# Patient Record
Sex: Male | Born: 2000 | Race: White | Hispanic: No | Marital: Single | Smoking: Never smoker
Health system: Southern US, Community
[De-identification: ages and names within clinical notes are randomized; demographics above are authoritative.]

## PROBLEM LIST (undated history)

## (undated) DIAGNOSIS — R7303 Prediabetes: Secondary | ICD-10-CM

## (undated) HISTORY — DX: Prediabetes: R73.03

---
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
## 2021-08-27 ENCOUNTER — Ambulatory Visit: Payer: BC Managed Care – PPO

## 2023-08-05 DIAGNOSIS — Z0289 Encounter for other administrative examinations: Secondary | ICD-10-CM

## 2023-08-09 ENCOUNTER — Ambulatory Visit (INDEPENDENT_AMBULATORY_CARE_PROVIDER_SITE_OTHER): Payer: BC Managed Care – PPO | Admitting: Bariatrics

## 2023-08-09 ENCOUNTER — Encounter: Payer: Self-pay | Admitting: Bariatrics

## 2023-08-09 VITALS — BP 146/77 | HR 83 | Temp 97.8°F | Ht 68.0 in | Wt 369.0 lb

## 2023-08-09 DIAGNOSIS — E65 Localized adiposity: Secondary | ICD-10-CM

## 2023-08-09 DIAGNOSIS — R030 Elevated blood-pressure reading, without diagnosis of hypertension: Secondary | ICD-10-CM | POA: Diagnosis not present

## 2023-08-09 DIAGNOSIS — Z6841 Body Mass Index (BMI) 40.0 and over, adult: Secondary | ICD-10-CM

## 2023-08-09 NOTE — Progress Notes (Signed)
Office: [phone]  /  Fax: [phone]   Initial Visit  Nicholas Nixon was seen in clinic today to evaluate for obesity. He is interested in losing weight to improve overall health and reduce the risk of weight related complications. He presents today to review program treatment options, initial physical assessment, and evaluation.     He was referred by: Friend or Family  When asked what else they would like to accomplish? He states: Adopt healthier eating patterns and Improve quality of life  When asked how has your weight affected you? He states: Problems with eating patterns (boredom)   Some associated conditions: Prediabetes Took Metformin   Contributing factors: Family history and Stress  Weight promoting medications identified: None  Current nutrition plan: Portion control / smart choices and Other: Optivia,   Current level of physical activity: Walking and NEAT  Current or previous pharmacotherapy: None  Response to medication: Never tried medications   Past medical history includes:  History reviewed. No pertinent past medical history.   Objective:   BP (!) 146/77   Pulse 83   Temp 97.8 F (36.6 C)   Ht 5\' 8"  (1.727 m)   Wt 269 lb (122 kg)   SpO2 98%   BMI 40.90 kg/m  He was weighed on the bioimpedance scale: Body mass index is 40.9 kg/m.  Peak Weight:269 , Body Fat%:45.2%, Visceral Fat Rating:31, Weight trend over the last 12 months: Increasing  General:  Alert, oriented and cooperative. Patient is in no acute distress.  Respiratory: Normal respiratory effort, no problems with respiration noted  Extremities: Normal range of motion.    Mental Status: Normal mood and affect. Normal behavior. Normal judgment and thought content.   DIAGNOSTIC DATA REVIEWED:  BMET No results found for: "NA", "K", "CL", "CO2", "GLUCOSE", "BUN", "CREATININE", "CALCIUM", "GFRNONAA", "GFRAA" No results found for: "HGBA1C" No results found for: "INSULIN" CBC No  results found for: "WBC", "RBC", "HGB", "HCT", "PLT", "MCV", "MCH", "MCHC", "RDW" Iron/TIBC/Ferritin/ %Sat No results found for: "IRON", "TIBC", "FERRITIN", "IRONPCTSAT" Lipid Panel  No results found for: "CHOL", "TRIG", "HDL", "CHOLHDL", "VLDL", "LDLCALC", "LDLDIRECT" Hepatic Function Panel  No results found for: "PROT", "ALBUMIN", "AST", "ALT", "ALKPHOS", "BILITOT", "BILIDIR", "IBILI" No results found for: "TSH"   Assessment and Plan:   Elevated blood pressure without diagnosis of hypertension Blood pressure is noted to be borderline elevated today, but normal in the past. Allante denies chest pains or SOB. BP Readings from Last 3 Encounters:  08/09/23 (!) 146/77    Plan: Continue to monitor.  If blood pressure continues to be elevated at future office visits, will consider starting antihypertensive medication. Patient will recheck BP at home in the AM 3 times a week and will bring in readings.  No added salt.    Visceral Obesity.   He has a visceral fat rating of 31 per the bio-impedence scale.   Plan: The goal is a visceral fat rating of 13 or below.  Will work on the plan and increase exercise/begin exercise.  Information sheet on " Tips to lose belly fat ". Aware that belly fat may be equate to visceral fat, but many of the same tips can help both subcutaneous and visceral fat.  Information sheet on " Healthy and Unhealthy fats.  Will minimize all carbohydrates ( sweets and starches ).     Morbid Obesity: Current BMI 40.90    Obesity Treatment / Action Plan:  Patient will work on garnering support from family and friends to begin weight  loss journey. Will work on eliminating or reducing the presence of highly palatable, calorie dense foods in the home. Will complete provided nutritional and psychosocial assessment questionnaire before the next appointment. Will be scheduled for indirect calorimetry to determine resting energy expenditure in a fasting state.  This will  allow [place] to create a reduced calorie, high-protein meal plan to promote loss of fat mass while preserving muscle mass. Counseled on the health benefits of losing 5%-15% of total body weight. Was counseled on nutritional approaches to weight loss and benefits of reducing processed foods and consuming plant-based foods and high quality protein as part of nutritional weight management. Was counseled on pharmacotherapy and role as an adjunct in weight management.   Obesity Education Performed Today:  He was weighed on the bioimpedance scale and results were discussed and documented in the synopsis.  We discussed obesity as a disease and the importance of a more detailed evaluation of all the factors contributing to the disease.  We discussed the importance of long term lifestyle changes which include nutrition, exercise and behavioral modifications as well as the importance of customizing this to his specific health and social needs.  We discussed the benefits of reaching a healthier weight to alleviate the symptoms of existing conditions and reduce the risks of the biomechanical, metabolic and psychological effects of obesity.  Discussed New Patient/Late Arrival, and Cancellation Policies. Patient voiced understanding and allowed to ask questions.   PAYTON PRINSEN appears to be in the action stage of change and states they are ready to start intensive lifestyle modifications and behavioral modifications.  30 minutes was spent today on this visit including the above counseling, pre-visit chart review, and post-visit documentation.  Reviewed by clinician on day of visit: allergies, medications, problem list, medical history, surgical history, family history, social history, and previous encounter notes.    Gavrielle Streck A. Lorretta HarpO.

## 2023-08-18 ENCOUNTER — Encounter: Payer: Self-pay | Admitting: Bariatrics

## 2023-08-18 ENCOUNTER — Ambulatory Visit: Payer: BC Managed Care – PPO | Admitting: Bariatrics

## 2023-08-18 VITALS — BP 112/69 | HR 71 | Temp 97.8°F | Ht 68.0 in | Wt 365.0 lb

## 2023-08-18 DIAGNOSIS — Z6841 Body Mass Index (BMI) 40.0 and over, adult: Secondary | ICD-10-CM | POA: Diagnosis not present

## 2023-08-18 DIAGNOSIS — R5383 Other fatigue: Secondary | ICD-10-CM | POA: Diagnosis not present

## 2023-08-18 DIAGNOSIS — R0602 Shortness of breath: Secondary | ICD-10-CM | POA: Diagnosis not present

## 2023-08-18 DIAGNOSIS — Z0000 Encounter for general adult medical examination without abnormal findings: Secondary | ICD-10-CM

## 2023-08-18 DIAGNOSIS — E559 Vitamin D deficiency, unspecified: Secondary | ICD-10-CM | POA: Diagnosis not present

## 2023-08-18 DIAGNOSIS — Z1331 Encounter for screening for depression: Secondary | ICD-10-CM

## 2023-08-18 DIAGNOSIS — Z833 Family history of diabetes mellitus: Secondary | ICD-10-CM | POA: Diagnosis not present

## 2023-08-18 NOTE — Progress Notes (Signed)
At a Glance:  Vitals Temp: 97.8 F (36.6 C) BP: 112/69 Pulse Rate: 71 SpO2: 95 %   Anthropometric Measurements Height: 5\' 8"  (1.727 m) Weight: (!) 365 lb (165.6 kg) BMI (Calculated): 55.51 Starting Weight: 365lb   Body Composition  Body Fat %: 40.9 % Fat Mass (lbs): 149.6 lbs Muscle Mass (lbs): 205.6 lbs Total Body Water (lbs): 156.8 lbs Visceral Fat Rating : 27   Other Clinical Data RMR: 2506 Fasting: yes Labs: yes Today's Visit #: 1 Starting Date: 08/18/23    EKG: Normal sinus rhythm, rate 78. No abnormal EKG's in the past.  Indirect Calorimeter:   Resting Metabolic Rate ( RMR):  RMR (actual): 2506 kcal RMR (calculated): 3194kcal The calculated basal metabolic rate is 1610 kcal thus his basal metabolic rate is worse than expected.  Plan:   Indirect calorimeter completed, interpreted and reviewed with patient today and allowed to ask questions.  Discussed the implications for the chosen plan and exercise based on the RMR reading.  Will consider repeating the RMR in the future based on weight loss.    Chief Complaint:  Obesity   Subjective:  Nicholas Nixon (MR# [id number]) is a 22 y.o. male who presents for evaluation and treatment of obesity and related comorbidities.   Caffrey is currently in the action stage of change and ready to dedicate time achieving and maintaining a healthier weight. [place] is interested in becoming our patient and working on intensive lifestyle modifications including (but not limited to) diet and exercise for weight loss.  Shadie has been struggling with his weight. He has been unsuccessful in either losing weight, maintaining weight loss, or reaching his healthy weight goal.  Nayan's habits were reviewed today and are as follows: His family eats meals together, he thinks his family will eat healthier with him, he has been heavy most of his life, he started gaining weight in early childhood, he snacks frequently in  the evenings, he is frequently drinking liquids with calories, and he frequently makes poor food choices.   He started gaining weight in childhood. He has dealt with weight issues for most of his life.   Current or previous pharmacotherapy: None  Response to medication: Never tried medications  Other Fatigue Orson admits to daytime somnolence and admits to waking up still tired. Patient has a history of symptoms of daytime fatigue. Trinton generally gets 6 hours of sleep per night, and states that he has generally restful sleep. Snoring is not present. Apneic episodes are not present. Epworth Sleepiness Score is 6.   Shortness of Breath Add notes increasing shortness of breath with exercising and seems to be worsening over time with weight gain. He notes getting out of breath sooner with activity than he used to. This has not gotten worse recently. Haaris denies shortness of breath at rest or orthopnea.  Depression Screen Kanye's Food and Mood (modified PHQ-9) score was 0. <5 no depression     11/09/2018    9:10 AM  Depression screen PHQ 2/9  Decreased Interest 0  Down, Depressed, Hopeless 0  PHQ - 2 Score 0     Assessment and Plan:   Other Fatigue Silas does feel that his weight is causing his energy to be lower than it should be. Fatigue may be related to obesity, depression or many other causes. Labs will be ordered, and in the meanwhile, Grayer will focus on self care including making healthy food choices, increasing physical activity and focusing on stress reduction.  Shortness  of Breath Reef does feel that he gets out of breath more easily that he used to when he exercises. Wolf's shortness of breath appears to be obesity related and exercise induced. He has agreed to work on weight loss and gradually increase exercise to treat his exercise induced shortness of breath. Will continue to monitor closely. EKG: Essentially normal.   Health Maintenance:    Obesity   Plan: Will do EKG, indirect calorimetry, and labs.     Vitamin D Deficiency Vitamin D may be deficient  He is not on vitamin D No results found for: "VD25OH"  Plan: Check vitamin D  Depression screening  Previous labs reviewed today: no labs.    Family history of diabetes:   He has a family history of diabetes on the paternal and maternal sides.  He has not been diagnosed with diabetes, but has excessive weight,  Plan: Will do an insulin and a HGBA1c.   Labs done today CMP Lipids HgbA1C, insulin, vitamin D, and thyroid panel.    Morbid Obesity: BMI (Calculated): 55.51   Brianna is currently in the action stage of change and his goal is to begin weight loss efforts. I recommend Jondavid begin the structured treatment plan as follows:  He has agreed to Category 4 Plan  Exercise goals: For substantial health benefits, adults should do at least 150 minutes (2 hours and 30 minutes) a week of moderate-intensity, or 75 minutes (1 hour and 15 minutes) a week of vigorous-intensity aerobic physical activity, or an equivalent combination of moderate- and vigorous-intensity aerobic activity. Aerobic activity should be performed in episodes of at least 10 minutes, and preferably, it should be spread throughout the week.  Behavioral modification strategies:increasing lean protein intake, decreasing simple carbohydrates, increasing vegetables, increase H2O intake, decreasing eating out, no skipping meals, meal planning and cooking strategies, keeping healthy foods in the home, better snacking choices, and planning for success  He was informed of the importance of frequent follow-up visits to maximize his success with intensive lifestyle modifications for his multiple health conditions. He was informed we would discuss his lab results at his next visit unless there is a critical issue that needs to be addressed sooner. Konstantine agreed to keep his next visit at the agreed upon time  to discuss these results.  Objective:  General: Cooperative, alert, well developed, in no acute distress. HEENT: Conjunctivae and lids unremarkable. Cardiovascular: Regular rhythm.  Lungs: Normal work of breathing. Neurologic: No focal deficits.   No results found for: "CREATININE", "BUN", "NA", "K", "CL", "CO2" No results found for: "ALT", "AST", "GGT", "ALKPHOS", "BILITOT" No results found for: "HGBA1C" No results found for: "INSULIN" No results found for: "TSH" No results found for: "CHOL", "HDL", "LDLCALC", "LDLDIRECT", "TRIG", "CHOLHDL" No results found for: "WBC", "HGB", "HCT", "MCV", "PLT" No results found for: "IRON", "TIBC", "FERRITIN"  Attestation Statements:  Applicable history such as the following:  allergies, medications, problem list, medical history, surgical history, family history, social history, and previous encounter notes reviewed by clinician on day of visit:  Time spent on visit including the items listed below was 40 minutes.  -preparing to see the patient (e.g., review of tests, history, previous notes) -obtaining and/or reviewing separately obtained history -counseling and educating the patient/family/caregiver -documenting clinical information in the electronic or other health record -ordering medications, tests, or procedures -independently interpreting results and communicating results to the patient/ family/caregiver -referring and communicating with other health care professionals  -care coordination   This may have been prepared  with the assistance of Engineer, civil (consulting).  Occasional wrong-word or sound-a-like substitutions may have occurred due to the inherent limitations of voice recognition software.    Corinna Capra, DO

## 2023-08-19 ENCOUNTER — Encounter: Payer: Self-pay | Admitting: Bariatrics

## 2023-08-19 DIAGNOSIS — R7989 Other specified abnormal findings of blood chemistry: Secondary | ICD-10-CM | POA: Insufficient documentation

## 2023-08-19 DIAGNOSIS — E786 Lipoprotein deficiency: Secondary | ICD-10-CM | POA: Insufficient documentation

## 2023-08-19 DIAGNOSIS — E88819 Insulin resistance, unspecified: Secondary | ICD-10-CM | POA: Insufficient documentation

## 2023-08-19 DIAGNOSIS — E559 Vitamin D deficiency, unspecified: Secondary | ICD-10-CM | POA: Insufficient documentation

## 2023-08-19 DIAGNOSIS — R7303 Prediabetes: Secondary | ICD-10-CM | POA: Insufficient documentation

## 2023-08-19 LAB — COMPREHENSIVE METABOLIC PANEL
ALT: 48 [IU]/L — ABNORMAL HIGH (ref 0–44)
AST: 34 [IU]/L (ref 0–40)
Albumin: 4.1 g/dL — ABNORMAL LOW (ref 4.3–5.2)
Alkaline Phosphatase: 95 [IU]/L (ref 51–125)
BUN/Creatinine Ratio: 14 (ref 9–20)
BUN: 12 mg/dL (ref 6–20)
Bilirubin Total: 0.4 mg/dL (ref 0.0–1.2)
CO2: 21 mmol/L (ref 20–29)
Calcium: 9.6 mg/dL (ref 8.7–10.2)
Chloride: 102 mmol/L (ref 96–106)
Creatinine, Ser: 0.87 mg/dL (ref 0.76–1.27)
Globulin, Total: 2.8 g/dL (ref 1.5–4.5)
Glucose: 99 mg/dL (ref 70–99)
Potassium: 5.5 mmol/L — ABNORMAL HIGH (ref 3.5–5.2)
Sodium: 137 mmol/L (ref 134–144)
Total Protein: 6.9 g/dL (ref 6.0–8.5)
eGFR: 127 mL/min/{1.73_m2} (ref >59)

## 2023-08-19 LAB — LIPID PANEL WITH LDL/HDL RATIO
Cholesterol, Total: 177 mg/dL (ref 100–199)
HDL: 35 mg/dL — ABNORMAL LOW (ref >39)
LDL Chol Calc (NIH): 121 mg/dL — ABNORMAL HIGH (ref 0–99)
LDL/HDL Ratio: 3.5 ratio (ref 0.0–3.6)
Triglycerides: 117 mg/dL (ref 0–149)
VLDL Cholesterol Cal: 21 mg/dL (ref 5–40)

## 2023-08-19 LAB — TSH+T4F+T3FREE
Free T4: 1.35 ng/dL (ref 0.82–1.77)
T3, Free: 4.3 pg/mL (ref 2.0–4.4)
TSH: 0.245 u[IU]/mL — ABNORMAL LOW (ref 0.450–4.500)

## 2023-08-19 LAB — HEMOGLOBIN A1C
Est. average glucose Bld gHb Est-mCnc: 120 mg/dL
Hgb A1c MFr Bld: 5.8 % — ABNORMAL HIGH (ref 4.8–5.6)

## 2023-08-19 LAB — VITAMIN D 25 HYDROXY (VIT D DEFICIENCY, FRACTURES): Vit D, 25-Hydroxy: 22.3 ng/mL — ABNORMAL LOW (ref 30.0–100.0)

## 2023-08-19 LAB — INSULIN, RANDOM: INSULIN: 31.2 u[IU]/mL — ABNORMAL HIGH (ref 2.6–24.9)

## 2023-09-01 ENCOUNTER — Ambulatory Visit (INDEPENDENT_AMBULATORY_CARE_PROVIDER_SITE_OTHER): Payer: BC Managed Care – PPO | Admitting: Bariatrics

## 2023-09-09 ENCOUNTER — Encounter: Payer: Self-pay | Admitting: Bariatrics

## 2023-09-09 ENCOUNTER — Ambulatory Visit: Payer: BC Managed Care – PPO | Admitting: Bariatrics

## 2023-09-09 VITALS — BP 117/71 | HR 78 | Temp 97.4°F | Ht 68.0 in | Wt 367.0 lb

## 2023-09-09 DIAGNOSIS — E559 Vitamin D deficiency, unspecified: Secondary | ICD-10-CM

## 2023-09-09 DIAGNOSIS — R202 Paresthesia of skin: Secondary | ICD-10-CM

## 2023-09-09 DIAGNOSIS — R7303 Prediabetes: Secondary | ICD-10-CM | POA: Diagnosis not present

## 2023-09-09 DIAGNOSIS — Z6841 Body Mass Index (BMI) 40.0 and over, adult: Secondary | ICD-10-CM

## 2023-09-09 MED ORDER — VITAMIN D (ERGOCALCIFEROL) 1.25 MG (50000 UNIT) PO CAPS: 50000.0000 [IU] | ORAL_CAPSULE | ORAL | 0 refills | Status: DC

## 2023-09-09 MED ORDER — METFORMIN HCL 500 MG PO TABS: 500.0000 mg | ORAL_TABLET | Freq: Two times a day (BID) | ORAL | 0 refills | Status: DC

## 2023-09-09 NOTE — Progress Notes (Signed)
WEIGHT SUMMARY AND BIOMETRICS  Weight Lost Since Last Visit: 0  Weight Gained Since Last Visit: 2lb   Vitals Temp: (!) 97.4 F (36.3 C) BP: 117/71 Pulse Rate: 78 SpO2: 97 %   Anthropometric Measurements Height: 5\' 8"  (1.727 m) Weight: (!) 367 lb (166.5 kg) BMI (Calculated): 55.82 Weight at Last Visit: 365lb Weight Lost Since Last Visit: 0 Weight Gained Since Last Visit: 2lb Starting Weight: 365lb Total Weight Loss (lbs): 0 lb (0 kg)   Body Composition  Body Fat %: 45.6 % Fat Mass (lbs): 167.4 lbs Muscle Mass (lbs): 190 lbs Total Body Water (lbs): 154.6 lbs Visceral Fat Rating : 31   Other Clinical Data Fasting: no Labs: no Today's Visit #: 2 Starting Date: 08/18/23    OBESITY Hiatt is here to discuss his progress with his obesity treatment plan along with follow-up of his obesity related diagnoses.    Nutrition Plan: the Category 4 plan - 25% adherence.  Current exercise: none  Interim History:  He is up 2 lbs since his last visit.  He has struggled some with the plan over the holidays. Eating all of the food on the plan., Protein intake is as prescribed, Is skipping meals, Water intake is adequate., and Reports polyphagia   Hunger is moderately controlled.  Cravings are moderately controlled.  Assessment/Plan:   Vitamin D Deficiency Vitamin D is not at goal of 50.  Most recent vitamin D level was 22.3. He is not on vitamin D at this time Lab Results  Component Value Date   VD25OH 22.3 (L) 08/18/2023    Plan: Begin prescription vitamin D 50,000 IU weekly.   Prediabetes Last A1c was 5.8  Medication(s): none  Lab Results  Component Value Date   HGBA1C 5.8 (H) 08/18/2023   Lab Results  Component Value Date   INSULIN 31.2 (H) 08/18/2023    Plan: Information sheet on " Insulin Resistance and Prediabetes".  Will minimize  all refined carbohydrates both sweets and starches.  Will work on the plan and exercise.  Consider both aerobic and resistance training.  Will keep protein, water, and fiber intake high.  Aim for 7 to 9 hours of sleep nightly.  Start Metformin 500 mg twice daily with meals  Will consider a GLP-1.  He denies any absolute contraindications.  I discussed the risk and benefits with him and he was given an information sheet discussing the medications.  He will check with his insurance company in regard to the GLP-1's and we may start him on this medication at his next visit.  Paresthesias of both hands bilateral:  He has had some intermittent paresthesias in his hands bilaterally in the ulnar distribution.  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  He has not been evaluated for these problems.  He denies any paresthesias or neuropathy in  his lower extremities.  Plan: Referral to a neurologist for intermittent paresthesias in both hands and ulnar distribution.    Morbid Obesity: Current BMI BMI (Calculated): 55.82   Pharmacotherapy Plan Start  Metformin 500 mg twice daily with meals  Tyhiem is currently in the action stage of change. As such, his goal is to continue with weight loss efforts.  He has agreed to the Category 4 plan.  Exercise goals: All adults should avoid inactivity. Some physical activity is better than none, and adults who participate in any amount of physical activity gain some health benefits.  Behavioral modification strategies: increasing lean protein intake, decreasing simple carbohydrates , no meal skipping, decrease eating out, meal planning , increase water intake, better snacking choices, planning for success, increasing vegetables, decreasing sodium intake, keep healthy foods in the home, travel eating strategies, and mindful eating.  Delvis has agreed to follow-up with our clinic in 2 weeks.       Objective:   VITALS: Per patient  if applicable, see vitals. GENERAL: Alert and in no acute distress. CARDIOPULMONARY: No increased WOB. Speaking in clear sentences.  PSYCH: Pleasant and cooperative. Speech normal rate and rhythm. Affect is appropriate. Insight and judgement are appropriate. Attention is focused, linear, and appropriate.  NEURO: Oriented as arrived to appointment on time with no prompting.   Attestation Statements:    This was prepared with the assistance of Engineer, civil (consulting).  Occasional wrong-word or sound-a-like substitutions may have occurred due to the inherent limitations of voice recognition

## 2023-09-09 NOTE — Addendum Note (Signed)
Addended byIva Lento T on: 09/09/2023 03:37 PM   Modules accepted: Orders

## 2023-09-23 ENCOUNTER — Ambulatory Visit: Payer: BC Managed Care – PPO | Admitting: Bariatrics

## 2023-09-23 ENCOUNTER — Encounter: Payer: Self-pay | Admitting: Bariatrics

## 2023-09-23 VITALS — BP 137/76 | HR 81 | Temp 97.4°F | Ht 68.0 in | Wt 369.0 lb

## 2023-09-23 DIAGNOSIS — R632 Polyphagia: Secondary | ICD-10-CM

## 2023-09-23 DIAGNOSIS — E559 Vitamin D deficiency, unspecified: Secondary | ICD-10-CM

## 2023-09-23 DIAGNOSIS — R7303 Prediabetes: Secondary | ICD-10-CM

## 2023-09-23 DIAGNOSIS — E65 Localized adiposity: Secondary | ICD-10-CM

## 2023-09-23 DIAGNOSIS — Z6841 Body Mass Index (BMI) 40.0 and over, adult: Secondary | ICD-10-CM

## 2023-09-23 MED ORDER — ZEPBOUND 2.5 MG/0.5ML ~~LOC~~ SOAJ: 2.5000 mg | SUBCUTANEOUS | 0 refills | Status: DC

## 2023-09-23 MED ORDER — METFORMIN HCL 500 MG PO TABS: 500.0000 mg | ORAL_TABLET | Freq: Two times a day (BID) | ORAL | 0 refills | Status: DC

## 2023-09-23 MED ORDER — VITAMIN D (ERGOCALCIFEROL) 1.25 MG (50000 UNIT) PO CAPS: 50000.0000 [IU] | ORAL_CAPSULE | ORAL | 0 refills | Status: DC

## 2023-09-23 NOTE — Progress Notes (Signed)
WEIGHT SUMMARY AND BIOMETRICS  Weight Lost Since Last Visit: 0  Weight Gained Since Last Visit: 2lb   Vitals Temp: (!) 97.4 F (36.3 C) BP: 137/76 Pulse Rate: 81 SpO2: 97 %   Anthropometric Measurements Height: 5\' 8"  (1.727 m) Weight: (!) 369 lb (167.4 kg) BMI (Calculated): 56.12 Weight at Last Visit: 367lb Weight Lost Since Last Visit: 0 Weight Gained Since Last Visit: 2lb Starting Weight: 365lb Total Weight Loss (lbs): 0 lb (0 kg)   Body Composition  Body Fat %: 45.8 % Fat Mass (lbs): 169 lbs Muscle Mass (lbs): 190.2 lbs Total Body Water (lbs): 155.4 lbs Visceral Fat Rating : 31   Other Clinical Data Fasting: no Labs: no Today's Visit #: 3 Starting Date: 08/18/23    OBESITY Pius is here to discuss his progress with his obesity treatment plan along with follow-up of his obesity related diagnoses.    Nutrition Plan: the Category 4 plan - 60-80% adherence.  Current exercise: none  Interim History:  He is up 2 lbs since his last visit.  Eating all of the food on the plan., Is not drinking sugar sweetened beverages., Is skipping meals, Meeting protein goals., and Reports polyphagia   Pharmacotherapy: Amilcar is on Metformin 500 mg twice daily with meals. He usually only takes his morning dose and the sometimes forgets his evening dose.  Adverse side effects: None Hunger is poorly controlled.  Cravings are moderately controlled.  Assessment/Plan:  Vitamin D Deficiency Vitamin D is not at goal of 50.  Most recent vitamin D level was 22.3. He is on  prescription ergocalciferol 50,000 IU weekly. Lab Results  Component Value Date   VD25OH 22.3 (L) 08/18/2023    Plan: Refill prescription vitamin D 50,000 IU weekly.   Visceral Obesity.   He has a visceral fat rating of 31 per the bio-impedence scale.   Plan: The goal is a visceral fat  rating of 13 or below.  Will work on the plan and increase exercise/begin exercise.  Information sheet on " Tips to lose belly fat ". Aware that belly fat may be equate to visceral fat, but many of the same tips can help both subcutaneous and visceral fat.  Information sheet on " Healthy and Unhealthy fats.  Will minimize all carbohydrates ( sweets and starches ).    Polyphagia Kadarius endorses excessive hunger.  Medication(s): Metformin  Effects of medication:  poorly controlled. Cravings are moderately controlled.   Plan: Medication(s): Zepbound 2.5 mg SQ weekly. He denies any contraindications. We discussed the risks and benefits.  Will increase water, protein and fiber to help assuage hunger.  Will minimize foods that have a high glucose index/load to minimize reactive hypoglycemia.     Morbid Obesity: Current BMI BMI (Calculated): 56.12   Pharmacotherapy Plan Start  Zepbound 2.5 mg SQ weekly  Treyshawn is currently in the action stage  of change. As such, his goal is to continue with weight loss efforts.  He has agreed to the Category 4 plan.  Exercise goals: For substantial health benefits, adults should do at least 150 minutes (2 hours and 30 minutes) a week of moderate-intensity, or 75 minutes (1 hour and 15 minutes) a week of vigorous-intensity aerobic physical activity, or an equivalent combination of moderate- and vigorous-intensity aerobic activity. Aerobic activity should be performed in episodes of at least 10 minutes, and preferably, it should be spread throughout the week.  Behavioral modification strategies: increasing lean protein intake, decreasing simple carbohydrates , no meal skipping, meal planning , increase water intake, better snacking choices, planning for success, increasing vegetables, avoiding temptations, keep healthy foods in the home, and mindful eating. Will not skip meals.   Travonta has agreed to follow-up with our clinic in 2 weeks.       Objective:    VITALS: Per patient if applicable, see vitals. GENERAL: Alert and in no acute distress. CARDIOPULMONARY: No increased WOB. Speaking in clear sentences.  PSYCH: Pleasant and cooperative. Speech normal rate and rhythm. Affect is appropriate. Insight and judgement are appropriate. Attention is focused, linear, and appropriate.  NEURO: Oriented as arrived to appointment on time with no prompting.   Attestation Statements:    This was prepared with the assistance of Engineer, civil (consulting).  Occasional wrong-word or sound-a-like substitutions may have occurred due to the inherent limitations of voice recognition   Corinna Capra, DO

## 2023-10-12 ENCOUNTER — Encounter: Payer: Self-pay | Admitting: Bariatrics

## 2023-10-12 ENCOUNTER — Ambulatory Visit (INDEPENDENT_AMBULATORY_CARE_PROVIDER_SITE_OTHER): Payer: Managed Care, Other (non HMO) | Admitting: Bariatrics

## 2023-10-12 VITALS — BP 144/67 | Ht 68.0 in | Wt 368.0 lb

## 2023-10-12 DIAGNOSIS — R632 Polyphagia: Secondary | ICD-10-CM

## 2023-10-12 DIAGNOSIS — E559 Vitamin D deficiency, unspecified: Secondary | ICD-10-CM

## 2023-10-12 DIAGNOSIS — Z6841 Body Mass Index (BMI) 40.0 and over, adult: Secondary | ICD-10-CM

## 2023-10-12 DIAGNOSIS — E66813 Obesity, class 3: Secondary | ICD-10-CM

## 2023-10-12 DIAGNOSIS — R7303 Prediabetes: Secondary | ICD-10-CM

## 2023-10-12 MED ORDER — METFORMIN HCL 500 MG PO TABS: 500.0000 mg | ORAL_TABLET | Freq: Two times a day (BID) | ORAL | 0 refills | Status: AC

## 2023-10-12 MED ORDER — VITAMIN D (ERGOCALCIFEROL) 1.25 MG (50000 UNIT) PO CAPS: 50000.0000 [IU] | ORAL_CAPSULE | ORAL | 0 refills | Status: DC

## 2023-10-12 NOTE — Progress Notes (Signed)
 WEIGHT SUMMARY AND BIOMETRICS  Weight Lost Since Last Visit: 1lb  Weight Gained Since Last Visit: 0   No data recorded Anthropometric Measurements Height: 5' 8 (1.727 m) Weight: (!) 368 lb (166.9 kg) BMI (Calculated): 55.97 Weight at Last Visit: 369lb Weight Lost Since Last Visit: 1lb Weight Gained Since Last Visit: 0 Starting Weight: 365lb Total Weight Loss (lbs): 0 lb (0 kg)   Body Composition  Body Fat %: 45.5 % Fat Mass (lbs): 167.4 lbs Muscle Mass (lbs): 190.8 lbs Total Body Water (lbs): 156.2 lbs Visceral Fat Rating : 31   Other Clinical Data Fasting: no Labs: no Today's Visit #: 4 Starting Date: 08/18/23    OBESITY Ric is here to discuss his progress with his obesity treatment plan along with follow-up of his obesity related diagnoses.    Nutrition Plan: the Category 4 plan - 25% adherence.  Current exercise:  Goes to the gym a couple times a week for exercise.  Interim History:  She is down 1 lb over the holidays. He has been struggling with some stress eating.  Eating all of the food on the plan., Protein intake is as prescribed, and Water intake is inadequate.   Pharmacotherapy: Jesaiah is on Zepbound  2.5 mg SQ weekly and Metformin  500 mg twice daily with meals Adverse side effects: no issues with Metformin .  Hunger is moderately controlled.  Cravings are moderately controlled.  Assessment/Plan:   Prediabetes Last A1c was 5.8  Medication(s): Zepbound  2.5 mg SQ weekly and Metformin  500 mg twice daily with meals.  He is Zepbound  was not started because of lack of insurance coverage.  I offered to try another GLP-1 but he declines at this time.  Lab Results  Component Value Date   HGBA1C 5.8 (H) 08/18/2023   Lab Results  Component Value Date   INSULIN  31.2 (H) 08/18/2023    Plan: Will minimize all refined carbohydrates both  sweets and starches.  Will work on the plan and exercise.  Consider both aerobic and resistance training.  Will keep protein, water, and fiber intake high.  Aim for 7 to 9 hours of sleep nightly.  Continue and refill Metformin  500 mg twice daily with meals   Polyphagia Everton endorses excessive hunger.  Medication(s): Zepbound  and Metformin .  He states that his Zepbound  was denied and it was too expensive.  He states that he wants to try to do his plan on an actual read and not to use any additional medications at this time. Effects of medication:  moderately controlled. Cravings are moderately controlled.   Plan: Medication(s): Metformin  500 mg twice daily with meals Will increase water, protein and fiber to help assuage hunger.  Will minimize foods that have a high glucose index/load to minimize reactive hypoglycemia.  He will start using either my fitness pal or lose it app to track his protein and calories.  Vitamin D  Deficiency Vitamin D  is not at goal of 50.  Most recent vitamin D  level was 22.3. He is on  prescription ergocalciferol  50,000 IU weekly. Lab Results  Component Value Date   VD25OH 22.3 (L) 08/18/2023    Plan: Refill prescription vitamin D  50,000 IU weekly.     Morbid Obesity: Current BMI BMI (Calculated): 55.97   Pharmacotherapy Plan Continue and refill  Metformin  500 mg twice daily with meals  Severin is currently in the action stage of change. As such, his goal is to continue with weight loss efforts.  He has agreed to the Category 4 plan and keeping a food journal with goal of 1,800 calories and 120  grams of protein daily.   Exercise goals: For substantial health benefits, adults should do at least 150 minutes (2 hours and 30 minutes) a week of moderate-intensity, or 75 minutes (1 hour and 15 minutes) a week of vigorous-intensity aerobic physical activity, or an equivalent combination of moderate- and vigorous-intensity aerobic activity. Aerobic activity  should be performed in episodes of at least 10 minutes, and preferably, it should be spread throughout the week.  Behavioral modification strategies: increasing lean protein intake, decreasing simple carbohydrates , no meal skipping, decrease eating out, meal planning , decrease liquid calories, increase water intake, better snacking choices, planning for success, decrease junk food, and get rid of junk food in the home.  Jabriel has agreed to follow-up with our clinic in 2 weeks.       Objective:   VITALS: Per patient if applicable, see vitals. GENERAL: Alert and in no acute distress. CARDIOPULMONARY: No increased WOB. Speaking in clear sentences.  PSYCH: Pleasant and cooperative. Speech normal rate and rhythm. Affect is appropriate. Insight and judgement are appropriate. Attention is focused, linear, and appropriate.  NEURO: Oriented as arrived to appointment on time with no prompting.   Attestation Statements:   This was prepared with the assistance of Engineer, Civil (consulting).  Occasional wrong-word or sound-a-like substitutions may have occurred due to the inherent limitations of voice recognition    Clayborne Daring, DO

## 2023-10-26 ENCOUNTER — Ambulatory Visit: Payer: Managed Care, Other (non HMO) | Admitting: Bariatrics

## 2023-11-03 ENCOUNTER — Encounter: Payer: Self-pay | Admitting: Bariatrics

## 2023-11-03 ENCOUNTER — Ambulatory Visit (INDEPENDENT_AMBULATORY_CARE_PROVIDER_SITE_OTHER): Payer: Managed Care, Other (non HMO) | Admitting: Bariatrics

## 2023-11-03 VITALS — BP 131/75 | HR 95 | Temp 97.2°F | Ht 68.0 in | Wt 358.0 lb

## 2023-11-03 DIAGNOSIS — Z6841 Body Mass Index (BMI) 40.0 and over, adult: Secondary | ICD-10-CM

## 2023-11-03 DIAGNOSIS — R7303 Prediabetes: Secondary | ICD-10-CM

## 2023-11-03 DIAGNOSIS — R632 Polyphagia: Secondary | ICD-10-CM

## 2023-11-03 NOTE — Progress Notes (Signed)
WEIGHT SUMMARY AND BIOMETRICS  Weight Lost Since Last Visit: 10lb  Weight Gained Since Last Visit: 0   Vitals Temp: (!) 97.2 F (36.2 C) BP: 131/75 Pulse Rate: 95 SpO2: 97 %   Anthropometric Measurements Height: 5\' 8"  (1.727 m) Weight: (!) 358 lb (162.4 kg) BMI (Calculated): 54.45 Weight at Last Visit: 368lb Weight Lost Since Last Visit: 10lb Weight Gained Since Last Visit: 0 Starting Weight: 365lb Total Weight Loss (lbs): 7 lb (3.175 kg)   Body Composition  Body Fat %: 44.6 % Fat Mass (lbs): 160 lbs Muscle Mass (lbs): 189.2 lbs Total Body Water (lbs): 152.2 lbs Visceral Fat Rating : 30   Other Clinical Data Fasting: no Labs: no Today's Visit #: 5 Starting Date: 08/18/23    OBESITY Ares is here to discuss his progress with his obesity treatment plan along with follow-up of his obesity related diagnoses.    Nutrition Plan: the Category 4 plan - 80% adherence.  Current exercise: walking  Interim History:  He is down 10 lbs since his last visit. He is taking his Metformin more consistently.  Eating all of the food on the plan., Is not skipping meals, and Water intake is adequate.   Pharmacotherapy: Rachel is on Metformin 500 mg twice daily with meals Adverse side effects: None Hunger is moderately controlled.  Cravings are moderately controlled.  Assessment/Plan:   [employer] endorses excessive hunger.  Medication(s): Metformin Effects of medication:  moderately controlled. Cravings are moderately controlled.   Plan: Medication(s): Metformin 500 mg twice daily with meals Will increase water, protein and fiber to help assuage hunger.  Will minimize foods that have a high glucose index/load to minimize reactive hypoglycemia.  Will continue to have a protein shake if he skips a meal.  He will take his Metformin consistently.      Morbid Obesity: Current BMI BMI (Calculated): 54.45   Prediabetes Last A1c was 5.8  Medication(s): Metformin Metformin 500 mg twice daily with meals Lab Results  Component Value Date   HGBA1C 5.8 (H) 08/18/2023   Lab Results  Component Value Date   INSULIN 31.2 (H) 08/18/2023    Plan: Will minimize all refined carbohydrates both sweets and starches.  Will work on the plan and exercise.  Consider both aerobic and resistance training.  Will keep protein, water, and fiber intake high.  Increase Polyunsaturated and Monounsaturated fats to increase satiety and encourage weight loss.  Aim for 7 to 9 hours of sleep nightly.  Will continue medications.   Continue Metformin 500 mg twice daily with meals   Pharmacotherapy Plan Continue  Metformin 500 mg twice daily with meals  Vartan is currently in the action stage of change. As such, his goal is to continue with weight loss efforts.  He has agreed to the Category 4 plan.  Exercise goals: For substantial health benefits, adults should do at  least 150 minutes (2 hours and 30 minutes) a week of moderate-intensity, or 75 minutes (1 hour and 15 minutes) a week of vigorous-intensity aerobic physical activity, or an equivalent combination of moderate- and vigorous-intensity aerobic activity. Aerobic activity should be performed in episodes of at least 10 minutes, and preferably, it should be spread throughout the week.  Behavioral modification strategies: increasing lean protein intake, decreasing simple carbohydrates , no meal skipping, meal planning , increase water intake, better snacking choices, planning for success, get rid of junk food in the home, ways to avoid night time snacking, keep healthy foods in the home, work on smaller portions, and mindful eating.  Garv has agreed to follow-up with our clinic in 3 weeks.       Objective:   VITALS: Per patient if applicable, see vitals. GENERAL: Alert and in no acute  distress. CARDIOPULMONARY: No increased WOB. Speaking in clear sentences.  PSYCH: Pleasant and cooperative. Speech normal rate and rhythm. Affect is appropriate. Insight and judgement are appropriate. Attention is focused, linear, and appropriate.  NEURO: Oriented as arrived to appointment on time with no prompting.   Attestation Statements:    This was prepared with the assistance of Engineer, civil (consulting).  Occasional wrong-word or sound-a-like substitutions may have occurred due to the inherent limitations of voice recognition   Corinna Capra, DO

## 2023-11-10 ENCOUNTER — Other Ambulatory Visit: Payer: Self-pay | Admitting: Bariatrics

## 2023-11-23 ENCOUNTER — Ambulatory Visit (INDEPENDENT_AMBULATORY_CARE_PROVIDER_SITE_OTHER): Payer: Managed Care, Other (non HMO) | Admitting: Bariatrics

## 2023-11-23 ENCOUNTER — Encounter: Payer: Self-pay | Admitting: Bariatrics

## 2023-11-23 VITALS — BP 136/74 | HR 70 | Temp 98.1°F | Ht 68.0 in | Wt 365.0 lb

## 2023-11-23 DIAGNOSIS — R7303 Prediabetes: Secondary | ICD-10-CM | POA: Diagnosis not present

## 2023-11-23 DIAGNOSIS — Z6841 Body Mass Index (BMI) 40.0 and over, adult: Secondary | ICD-10-CM

## 2023-11-23 NOTE — Progress Notes (Signed)
 WEIGHT SUMMARY AND BIOMETRICS  Weight Lost Since Last Visit: 0lb  Weight Gained Since Last Visit: 7lb   Vitals Temp: 98.1 F (36.7 C) BP: 136/74 Pulse Rate: 70 SpO2: 98 %   Anthropometric Measurements Height: 5\' 8"  (1.727 m) Weight: (!) 365 lb (165.6 kg) BMI (Calculated): 55.51 Weight at Last Visit: 358lb Weight Lost Since Last Visit: 0lb Weight Gained Since Last Visit: 7lb Starting Weight: 365lb Total Weight Loss (lbs): 0 lb (0 kg)   Body Composition  Body Fat %: 46.2 % Fat Mass (lbs): 168.8 lbs Muscle Mass (lbs): 186.8 lbs Total Body Water (lbs): 157.4 lbs Visceral Fat Rating : 31   Other Clinical Data Fasting: No Labs: No Today's Visit #: 6 Starting Date: 08/18/23    OBESITY Janari is here to discuss his progress with his obesity treatment plan along with follow-up of his obesity related diagnoses.    Nutrition Plan: the Category 4 plan - 60% adherence.  Current exercise: cardiovascular workout on exercise equipment and weightlifting  Interim History:  He is up 7 lbs since his last visit.  Eating all of the food on the plan., Is not skipping meals, and Water intake is adequate. He struggles with his carbohydrates.    Pharmacotherapy: Taiyo is on Metformin 500 mg twice daily with meals Adverse side effects: None Hunger is moderately controlled.  Cravings are moderately controlled.  Assessment/Plan:   Prediabetes Last A1c was 5.8  Medication(s): none. We have tried to get several GLP-1's, but his insurance will not pay for any medications.  Lab Results  Component Value Date   HGBA1C 5.8 (H) 08/18/2023   Lab Results  Component Value Date   INSULIN 31.2 (H) 08/18/2023    Plan: Will minimize all refined carbohydrates both sweets and starches.  Will work on the plan and exercise.  Consider both aerobic and resistance training.   Will keep protein, water, and fiber intake high.  He will take his lunch for work. He will decrease his carbohydrates and will eat a protein snack instead of a carb snack. He will minimize his carbs at his evening meal.  Will work on distraction techniques for his boredom eating and will try to do more exercise. He will refer to his journaling sheet for his calories and his protein.  Will continue his cardio and resistance exercise.    Morbid Obesity: Current BMI BMI (Calculated): 55.51    Kermit is currently in the action stage of change. As such, his goal is to continue with weight loss efforts.  He has agreed to the Category 4 plan.  Exercise goals: All adults should avoid inactivity. Some physical activity is better than none, and adults who participate in any amount of physical activity gain some health benefits. He is doing cardio and resistance.   Behavioral modification strategies: increasing lean protein intake, decreasing simple carbohydrates , no meal  skipping, decrease eating out, meal planning , increase water intake, better snacking choices, planning for success, increasing vegetables, emotional eating strategies, decrease junk food, decrease snacking , avoiding temptations, keep healthy foods in the home, pack lunch for work, and mindful eating.  [place] has agreed to follow-up with our clinic in 3 weeks.       Objective:   VITALS: Per patient if applicable, see vitals. GENERAL: Alert and in no acute distress. CARDIOPULMONARY: No increased WOB. Speaking in clear sentences.  PSYCH: Pleasant and cooperative. Speech normal rate and rhythm. Affect is appropriate. Insight and judgement are appropriate. Attention is focused, linear, and appropriate.  NEURO: Oriented as arrived to appointment on time with no prompting.   Attestation Statements:   This was prepared with the assistance of Engineer, civil (consulting).  Occasional wrong-word or sound-a-like substitutions may have  occurred due to the inherent limitations of voice recognition   Corinna Capra, DO

## 2023-12-07 ENCOUNTER — Other Ambulatory Visit: Payer: Self-pay

## 2023-12-07 MED ORDER — VITAMIN D (ERGOCALCIFEROL) 1.25 MG (50000 UNIT) PO CAPS: 50000.0000 [IU] | ORAL_CAPSULE | ORAL | 0 refills | Status: AC

## 2023-12-14 ENCOUNTER — Ambulatory Visit: Payer: Managed Care, Other (non HMO) | Admitting: Bariatrics

## 2023-12-21 ENCOUNTER — Ambulatory Visit: Admitting: Bariatrics

## 2023-12-22 ENCOUNTER — Telehealth: Payer: Self-pay | Admitting: Neurology

## 2023-12-22 NOTE — Telephone Encounter (Signed)
 [facility] conf

## 2023-12-23 ENCOUNTER — Ambulatory Visit: Payer: Self-pay | Admitting: Neurology

## 2023-12-23 ENCOUNTER — Encounter: Payer: Self-pay | Admitting: Neurology

## 2024-01-06 ENCOUNTER — Ambulatory Visit: Admitting: Bariatrics

## 2024-01-24 ENCOUNTER — Ambulatory Visit: Admitting: Bariatrics
